# Patient Record
Sex: Female | Born: 1965 | Race: White | Hispanic: No | Marital: Married
Health system: Southern US, Community
[De-identification: ages and names within clinical notes are randomized; demographics above are authoritative.]

---

## 2019-04-24 ENCOUNTER — Other Ambulatory Visit: Payer: Self-pay

## 2019-04-24 DIAGNOSIS — Z20822 Contact with and (suspected) exposure to covid-19: Secondary | ICD-10-CM

## 2019-04-26 LAB — NOVEL CORONAVIRUS, NAA: SARS-CoV-2, NAA: DETECTED — AB

## 2019-09-10 ENCOUNTER — Ambulatory Visit: Payer: Self-pay | Attending: Internal Medicine

## 2019-09-10 DIAGNOSIS — Z23 Encounter for immunization: Secondary | ICD-10-CM

## 2019-09-10 NOTE — Progress Notes (Signed)
   Covid-19 Vaccination Clinic  Name:  Laurie Morris    MRN: [id number] DOB: 06/27/1966  09/10/2019  Laurie Morris was observed post Covid-19 immunization for 15 minutes without incident. She was provided with Vaccine Information Sheet and instruction to access the V-Safe system.   Laurie Morris was instructed to call 911 with any severe reactions post vaccine: Marland Kitchen Difficulty breathing  . Swelling of face and throat  . A fast heartbeat  . A bad rash all over body  . Dizziness and weakness   Immunizations Administered    Name Date Dose VIS Date Route   Pfizer COVID-19 Vaccine 09/10/2019  2:45 PM 0.3 mL 06/13/2019 Intramuscular   Manufacturer: [employer], [employer]   Lot: [id number]   NDC: [address]-1

## 2019-10-01 ENCOUNTER — Ambulatory Visit: Payer: BC Managed Care – PPO | Attending: Internal Medicine

## 2019-10-01 DIAGNOSIS — Z23 Encounter for immunization: Secondary | ICD-10-CM

## 2019-10-01 NOTE — Progress Notes (Signed)
   Covid-19 Vaccination Clinic  Name:  Lilliana Turner    MRN: [id number] DOB: 24-Dec-1965  10/01/2019  Ms. Canova was observed post Covid-19 immunization for 15 minutes without incident. She was provided with Vaccine Information Sheet and instruction to access the V-Safe system.   Ms. Durell was instructed to call 911 with any severe reactions post vaccine: Marland Kitchen Difficulty breathing  . Swelling of face and throat  . A fast heartbeat  . A bad rash all over body  . Dizziness and weakness   Immunizations Administered    Name Date Dose VIS Date Route   Pfizer COVID-19 Vaccine 10/01/2019 11:09 AM 0.3 mL 06/13/2019 Intramuscular   Manufacturer: [employer], [employer]   Lot: [id number]   NDC: [address]-0

## 2020-12-29 ENCOUNTER — Other Ambulatory Visit: Payer: Self-pay | Admitting: Gerontology

## 2020-12-29 DIAGNOSIS — Z1231 Encounter for screening mammogram for malignant neoplasm of breast: Secondary | ICD-10-CM

## 2021-06-28 ENCOUNTER — Other Ambulatory Visit: Payer: Self-pay | Admitting: Gerontology

## 2021-06-28 DIAGNOSIS — Z1231 Encounter for screening mammogram for malignant neoplasm of breast: Secondary | ICD-10-CM

## 2021-08-29 ENCOUNTER — Ambulatory Visit
Admission: RE | Admit: 2021-08-29 | Discharge: 2021-08-29 | Disposition: A | Discharge status: Home / Self Care | Payer: BC Managed Care – PPO | Source: Ambulatory Visit | Attending: Gerontology | Admitting: Gerontology

## 2021-08-29 ENCOUNTER — Other Ambulatory Visit: Payer: Self-pay

## 2021-08-29 DIAGNOSIS — Z1231 Encounter for screening mammogram for malignant neoplasm of breast: Secondary | ICD-10-CM | POA: Diagnosis not present

## 2021-09-01 ENCOUNTER — Inpatient Hospital Stay
Admission: RE | Admit: 2021-09-01 | Discharge: 2021-09-01 | Disposition: A | Discharge status: Home / Self Care | Payer: Self-pay | Source: Ambulatory Visit | Attending: *Deleted | Admitting: *Deleted

## 2021-09-01 ENCOUNTER — Other Ambulatory Visit: Payer: Self-pay | Admitting: *Deleted

## 2021-09-01 DIAGNOSIS — Z1231 Encounter for screening mammogram for malignant neoplasm of breast: Secondary | ICD-10-CM

## 2022-06-30 ENCOUNTER — Telehealth: Payer: BC Managed Care – PPO | Admitting: Physician Assistant

## 2022-06-30 DIAGNOSIS — J019 Acute sinusitis, unspecified: Secondary | ICD-10-CM | POA: Diagnosis not present

## 2022-06-30 DIAGNOSIS — U071 COVID-19: Secondary | ICD-10-CM

## 2022-06-30 DIAGNOSIS — B9689 Other specified bacterial agents as the cause of diseases classified elsewhere: Secondary | ICD-10-CM

## 2022-06-30 DIAGNOSIS — B999 Unspecified infectious disease: Secondary | ICD-10-CM | POA: Diagnosis not present

## 2022-06-30 MED ORDER — BENZONATATE 100 MG PO CAPS: 100.0000 mg | ORAL_CAPSULE | Freq: Three times a day (TID) | ORAL | 0 refills | Status: AC | PRN

## 2022-06-30 MED ORDER — AMOXICILLIN-POT CLAVULANATE 875-125 MG PO TABS: 1.0000 | ORAL_TABLET | Freq: Two times a day (BID) | ORAL | 0 refills | Status: AC

## 2022-06-30 NOTE — Progress Notes (Signed)
E-Visit for Sinus Problems  We are sorry that you are not feeling well.  Here is how we plan to help!  Based on what you have shared with me it looks like you have sinusitis.  Sinusitis is inflammation and infection in the sinus cavities of the head.  Based on your presentation I believe you most likely have Acute Bacterial Sinusitis.  This is an infection caused by bacteria and is treated with antibiotics. I have prescribed Augmentin 875mg /125mg  one tablet twice daily with food, for 7 days. I have also prescribed Tessalon perles for cough. You may use an oral decongestant such as Mucinex D or if you have glaucoma or high blood pressure use plain Mucinex. Saline nasal spray help and can safely be used as often as needed for congestion.  If you develop worsening sinus pain, fever or notice severe headache and vision changes, or if symptoms are not better after completion of antibiotic, please schedule an appointment with a health care provider.    Sinus infections are not as easily transmitted as other respiratory infection, however we still recommend that you avoid close contact with loved ones, especially the very young and elderly.  Remember to wash your hands thoroughly throughout the day as this is the number one way to prevent the spread of infection!  Home Care: Only take medications as instructed by your medical team. Complete the entire course of an antibiotic. Do not take these medications with alcohol. A steam or ultrasonic humidifier can help congestion.  You can place a towel over your head and breathe in the steam from hot water coming from a faucet. Avoid close contacts especially the very young and the elderly. Cover your mouth when you cough or sneeze. Always remember to wash your hands.  Get Help Right Away If: You develop worsening fever or sinus pain. You develop a severe head ache or visual changes. Your symptoms persist after you have completed your treatment plan.  Make  sure you Understand these instructions. Will watch your condition. Will get help right away if you are not doing well or get worse.  Thank you for choosing an e-visit.  Your e-visit answers were reviewed by a board certified advanced clinical practitioner to complete your personal care plan. Depending upon the condition, your plan could have included both over the counter or prescription medications.  Please review your pharmacy choice. Make sure the pharmacy is open so you can pick up prescription now. If there is a problem, you may contact your provider through and have the prescription routed to another pharmacy.  Your safety is important to [employer]. If you have drug allergies check your prescription carefully.   For the next 24 hours you can use MyChart to ask questions about today's visit, request a non-urgent call back, or ask for a work or school excuse. You will get an email in the next two days asking about your experience. I hope that your e-visit has been valuable and will speed your recovery.  I have spent 5 minutes in review of e-visit questionnaire, review and updating patient chart, medical decision making and response to patient.   [place], PA-C

## 2022-07-04 ENCOUNTER — Other Ambulatory Visit: Payer: Self-pay | Admitting: Gerontology

## 2022-07-04 DIAGNOSIS — Z1231 Encounter for screening mammogram for malignant neoplasm of breast: Secondary | ICD-10-CM

## 2023-08-28 ENCOUNTER — Other Ambulatory Visit: Payer: Self-pay | Admitting: Gerontology

## 2023-08-28 DIAGNOSIS — Z1231 Encounter for screening mammogram for malignant neoplasm of breast: Secondary | ICD-10-CM

## 2024-01-16 IMAGING — MG MM DIGITAL SCREENING BILAT W/ TOMO AND CAD
8 series · 8 of 24 positions shown · non-contrast
Comparison: Previous exam(s).

CLINICAL DATA: Screening.

EXAM:
DIGITAL SCREENING BILATERAL MAMMOGRAM WITH TOMOSYNTHESIS AND CAD
TECHNIQUE: Bilateral screening digital craniocaudal and mediolateral oblique
mammograms were obtained. Bilateral screening digital breast
tomosynthesis was performed. The images were evaluated with
computer-aided detection.

[L CC synth-2D]
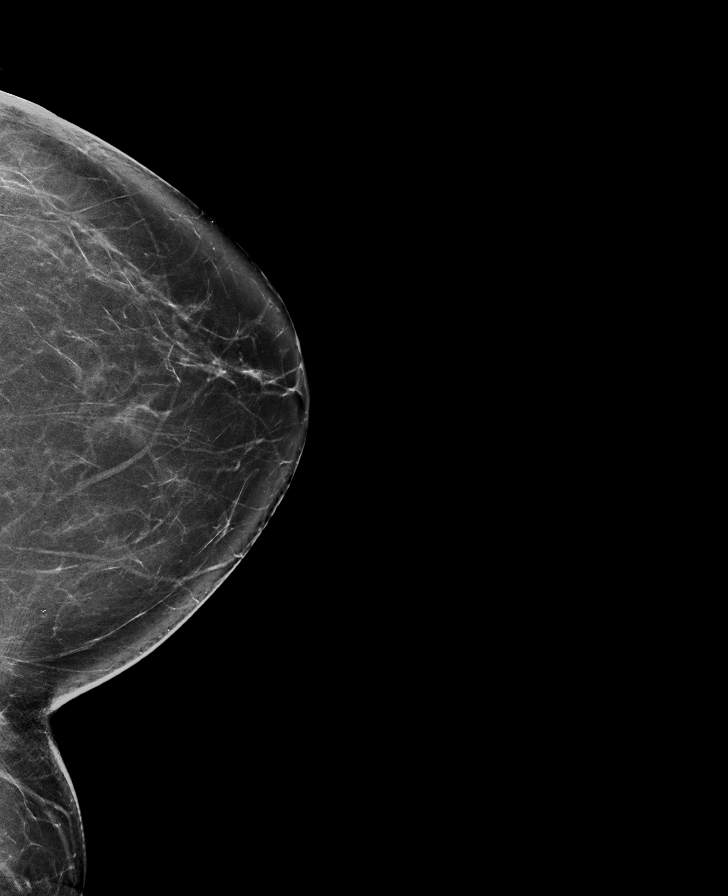

[R CC synth-2D]
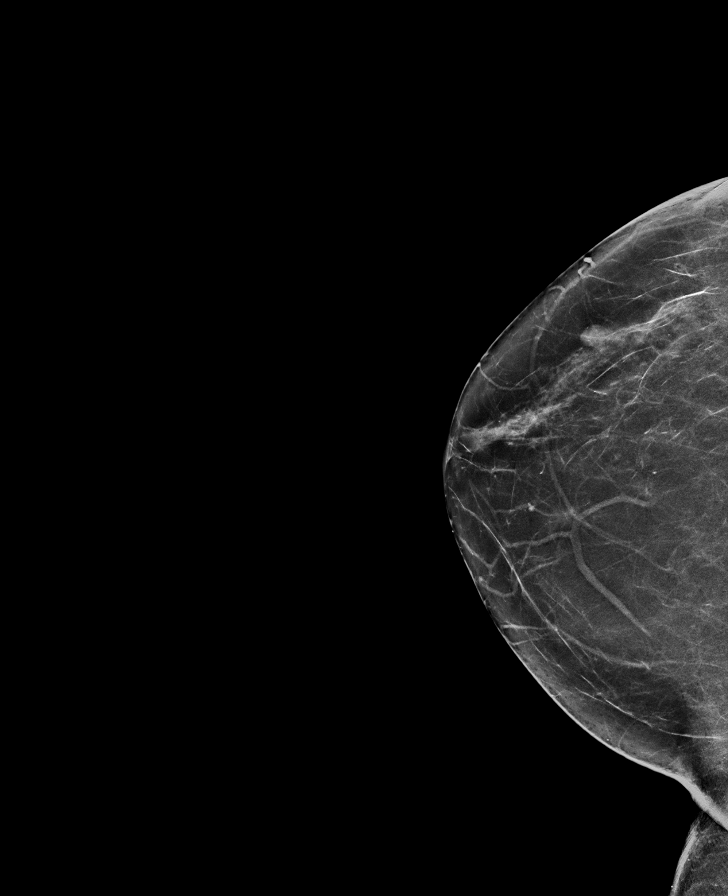

[L MLO synth-2D]
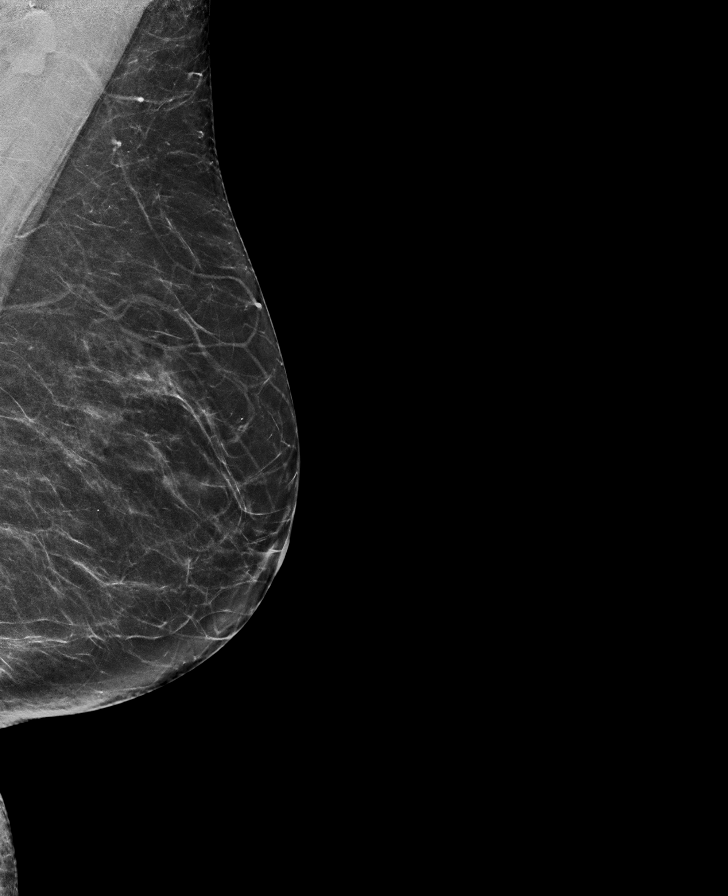

[R MLO synth-2D]
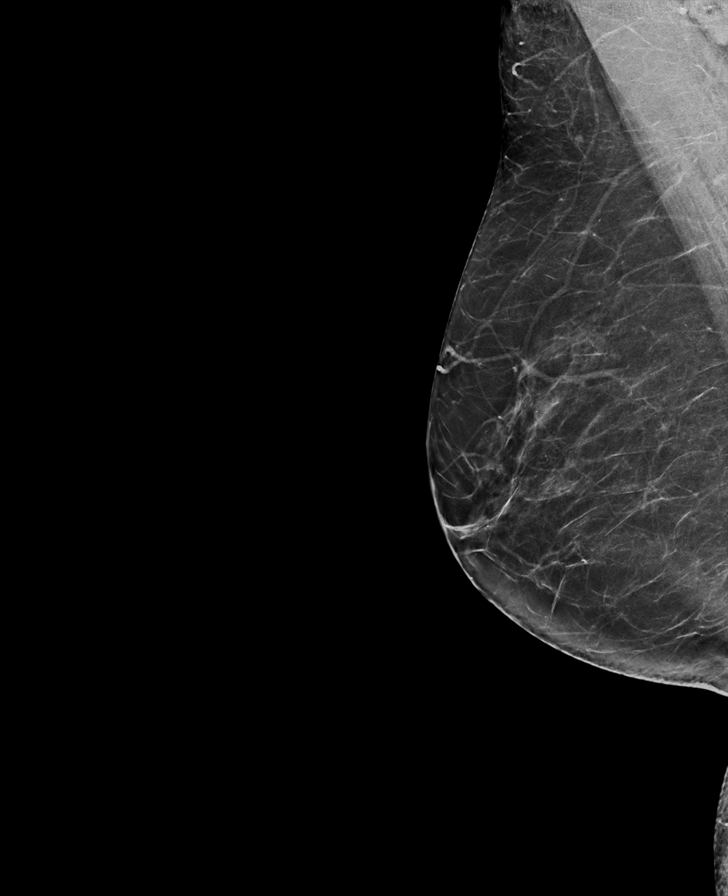

[R MLO tomo · tomo slice 36/71.0]
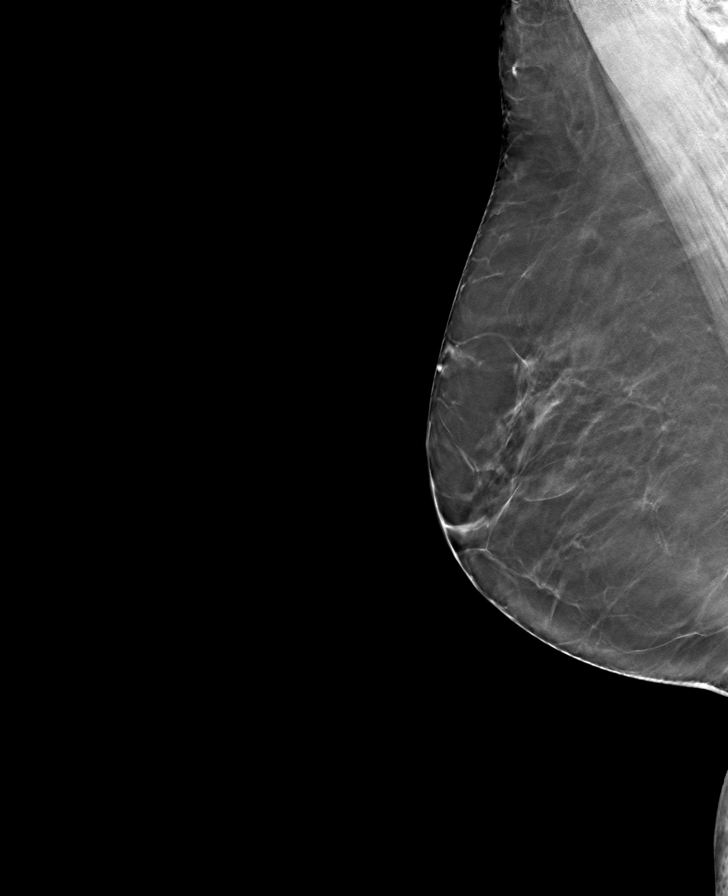

[L CC tomo · tomo slice 39/78.0]
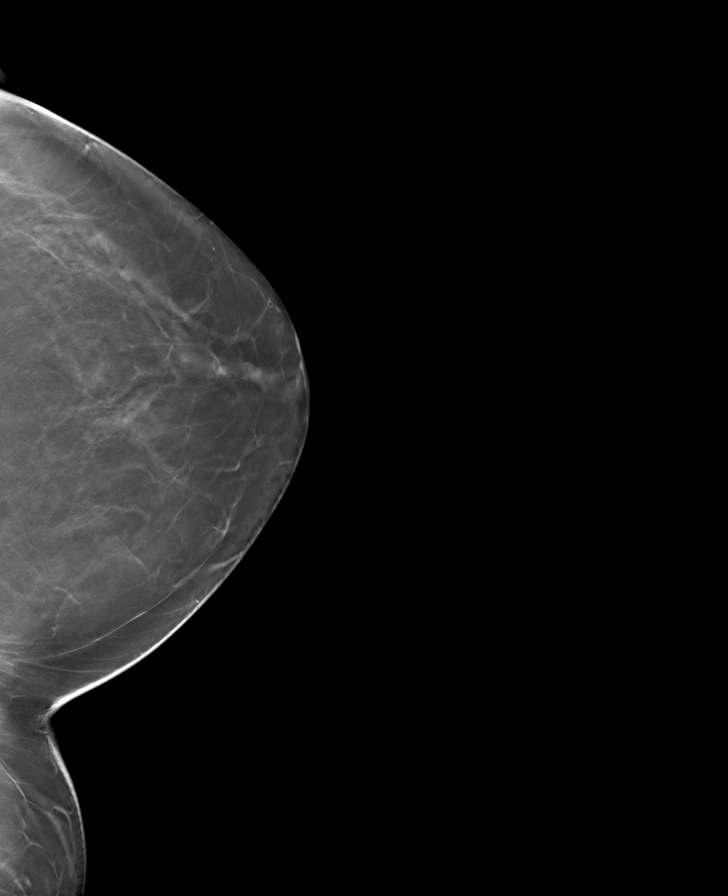

[L MLO tomo · tomo slice 35/69.0]
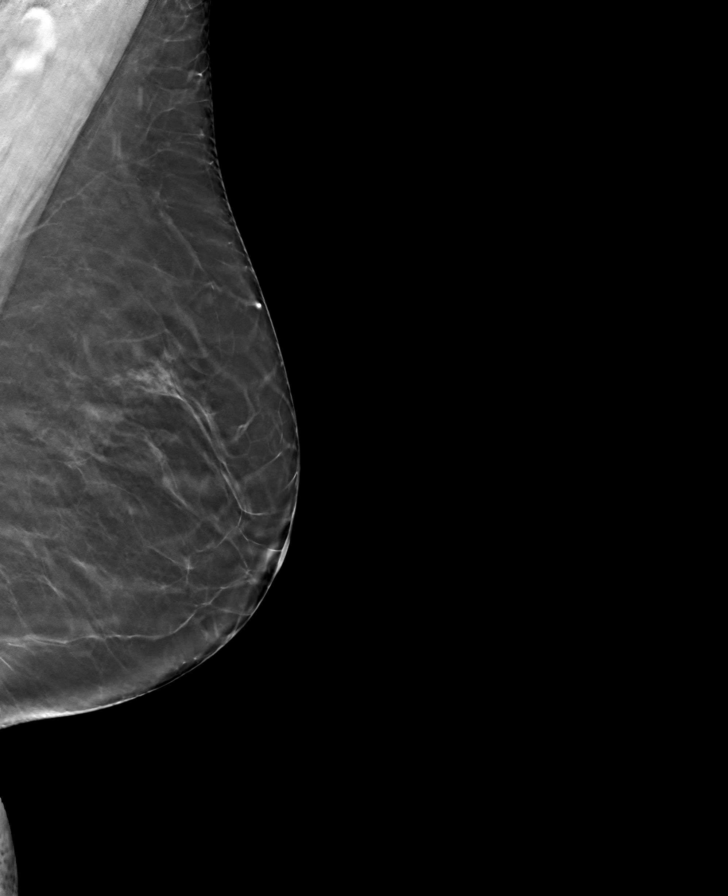

[R CC tomo · tomo slice 34/67.0]
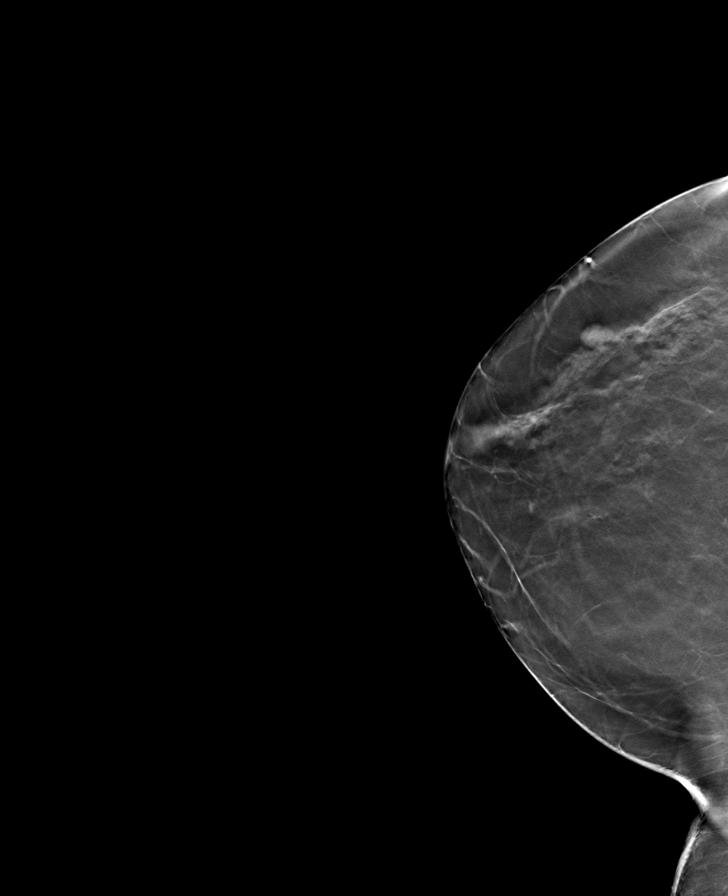

[8 of 24 positions shown; findings below may reference images not displayed]

ACR Breast Density Category b: There are scattered areas of
fibroglandular density.
FINDINGS: There are no findings suspicious for malignancy.
IMPRESSION: No mammographic evidence of malignancy. A result letter of this
screening mammogram will be mailed directly to the patient.

RECOMMENDATION:
Screening mammogram in one year. (Code:51-O-LD2)

BI-RADS CATEGORY  1: Negative.

## 2024-01-18 ENCOUNTER — Ambulatory Visit (INDEPENDENT_AMBULATORY_CARE_PROVIDER_SITE_OTHER): Payer: Self-pay

## 2024-01-18 DIAGNOSIS — Z1211 Encounter for screening for malignant neoplasm of colon: Secondary | ICD-10-CM | POA: Diagnosis present

## 2024-01-18 DIAGNOSIS — K3189 Other diseases of stomach and duodenum: Secondary | ICD-10-CM | POA: Diagnosis not present

## 2024-01-18 DIAGNOSIS — K641 Second degree hemorrhoids: Secondary | ICD-10-CM | POA: Diagnosis not present

## 2024-01-18 DIAGNOSIS — K449 Diaphragmatic hernia without obstruction or gangrene: Secondary | ICD-10-CM | POA: Diagnosis not present

## 2024-07-07 ENCOUNTER — Other Ambulatory Visit: Payer: Self-pay | Admitting: Gastroenterology

## 2024-07-07 DIAGNOSIS — R1010 Upper abdominal pain, unspecified: Secondary | ICD-10-CM

## 2024-07-07 DIAGNOSIS — R110 Nausea: Secondary | ICD-10-CM

## 2024-07-23 ENCOUNTER — Ambulatory Visit
Admission: RE | Admit: 2024-07-23 | Discharge: 2024-07-23 | Disposition: A | Discharge status: Home / Self Care | Payer: Self-pay | Source: Ambulatory Visit | Attending: Gerontology | Admitting: Gerontology

## 2024-07-23 DIAGNOSIS — Z1231 Encounter for screening mammogram for malignant neoplasm of breast: Secondary | ICD-10-CM | POA: Insufficient documentation
